# Patient Record
Sex: Female | Born: 1960 | Race: White | Hispanic: No | Marital: Married
Health system: Southern US, Community
[De-identification: ages and names within clinical notes are randomized; demographics above are authoritative.]

---

## 1999-05-27 ENCOUNTER — Other Ambulatory Visit: Admission: RE | Admit: 1999-05-27 | Discharge: 1999-05-27 | Payer: Self-pay | Admitting: Gynecology

## 1999-07-28 ENCOUNTER — Encounter: Admission: RE | Admit: 1999-07-28 | Discharge: 1999-07-28 | Payer: Self-pay | Admitting: Gynecology

## 1999-07-28 ENCOUNTER — Encounter: Payer: Self-pay | Admitting: Gynecology

## 2012-12-19 ENCOUNTER — Other Ambulatory Visit: Payer: Self-pay

## 2012-12-19 DIAGNOSIS — Z1231 Encounter for screening mammogram for malignant neoplasm of breast: Secondary | ICD-10-CM

## 2013-01-06 ENCOUNTER — Ambulatory Visit
Admission: RE | Admit: 2013-01-06 | Discharge: 2013-01-06 | Disposition: A | Discharge status: Home / Self Care | Payer: No Typology Code available for payment source | Source: Ambulatory Visit

## 2013-01-06 DIAGNOSIS — Z1231 Encounter for screening mammogram for malignant neoplasm of breast: Secondary | ICD-10-CM

## 2013-01-08 ENCOUNTER — Other Ambulatory Visit: Payer: Self-pay | Admitting: Family Medicine

## 2013-01-08 DIAGNOSIS — R928 Other abnormal and inconclusive findings on diagnostic imaging of breast: Secondary | ICD-10-CM

## 2013-01-28 ENCOUNTER — Ambulatory Visit
Admission: RE | Admit: 2013-01-28 | Discharge: 2013-01-28 | Disposition: A | Discharge status: Home / Self Care | Payer: No Typology Code available for payment source | Source: Ambulatory Visit | Attending: Family Medicine | Admitting: Family Medicine

## 2013-01-28 DIAGNOSIS — R928 Other abnormal and inconclusive findings on diagnostic imaging of breast: Secondary | ICD-10-CM

## 2014-04-17 ENCOUNTER — Other Ambulatory Visit: Payer: Self-pay

## 2014-04-17 DIAGNOSIS — Z1231 Encounter for screening mammogram for malignant neoplasm of breast: Secondary | ICD-10-CM

## 2014-05-01 ENCOUNTER — Ambulatory Visit
Admission: RE | Admit: 2014-05-01 | Discharge: 2014-05-01 | Disposition: A | Discharge status: Home / Self Care | Payer: No Typology Code available for payment source | Source: Ambulatory Visit

## 2014-05-01 DIAGNOSIS — Z1231 Encounter for screening mammogram for malignant neoplasm of breast: Secondary | ICD-10-CM

## 2016-03-15 ENCOUNTER — Other Ambulatory Visit: Payer: Self-pay | Admitting: Family Medicine

## 2016-03-15 DIAGNOSIS — Z1231 Encounter for screening mammogram for malignant neoplasm of breast: Secondary | ICD-10-CM

## 2016-04-13 ENCOUNTER — Ambulatory Visit
Admission: RE | Admit: 2016-04-13 | Discharge: 2016-04-13 | Disposition: A | Discharge status: Home / Self Care | Payer: Managed Care, Other (non HMO) | Source: Ambulatory Visit | Attending: Family Medicine | Admitting: Family Medicine

## 2016-04-13 DIAGNOSIS — Z1231 Encounter for screening mammogram for malignant neoplasm of breast: Secondary | ICD-10-CM

## 2017-03-14 ENCOUNTER — Other Ambulatory Visit: Payer: Self-pay | Admitting: Family Medicine

## 2017-03-14 DIAGNOSIS — Z1231 Encounter for screening mammogram for malignant neoplasm of breast: Secondary | ICD-10-CM

## 2017-04-16 ENCOUNTER — Ambulatory Visit
Admission: RE | Admit: 2017-04-16 | Discharge: 2017-04-16 | Disposition: A | Discharge status: Home / Self Care | Payer: 59 | Source: Ambulatory Visit | Attending: Family Medicine | Admitting: Family Medicine

## 2017-04-16 ENCOUNTER — Encounter: Payer: Self-pay | Admitting: Radiology

## 2017-04-16 DIAGNOSIS — Z1231 Encounter for screening mammogram for malignant neoplasm of breast: Secondary | ICD-10-CM

## 2018-04-24 ENCOUNTER — Other Ambulatory Visit: Payer: Self-pay | Admitting: Internal Medicine

## 2018-04-24 DIAGNOSIS — Z1231 Encounter for screening mammogram for malignant neoplasm of breast: Secondary | ICD-10-CM

## 2018-04-25 ENCOUNTER — Ambulatory Visit
Admission: RE | Admit: 2018-04-25 | Discharge: 2018-04-25 | Disposition: A | Discharge status: Home / Self Care | Payer: 59 | Source: Ambulatory Visit | Attending: Internal Medicine | Admitting: Internal Medicine

## 2018-04-25 DIAGNOSIS — Z1231 Encounter for screening mammogram for malignant neoplasm of breast: Secondary | ICD-10-CM

## 2019-07-01 ENCOUNTER — Telehealth: Payer: Self-pay | Admitting: Hematology

## 2019-07-01 NOTE — Telephone Encounter (Signed)
Received a new hem referral from Dr. Link Snuffer for eval of factor 5. Ms. Mariah Cobb been cld and scheduled to see Dr. Candise Che on 2/23 at 10am. Pt aware to arrive 15 minutes early.

## 2019-07-07 NOTE — Progress Notes (Signed)
HEMATOLOGY/ONCOLOGY CONSULTATION NOTE  Date of Service: 07/08/2019  Patient Care Team: Patient, No Pcp Per as PCP - General (General Practice)  CHIEF COMPLAINTS/PURPOSE OF CONSULTATION:  Factor V Leiden mutation evaluation  HISTORY OF PRESENTING ILLNESS:   Mariah Cobb is a wonderful 59 y.o. female who has been referred to [place] by Dr Link Snuffer for evaluation and management of factor V leiden mutation. The pt reports that she is doing well overall.   The pt reports that her brother recently had a PE and was found to have a Factor V Leiden mutation. There were no other obvious triggering events for his PE. Her brother is four years younger than her.   Pt has been healthy overall. Her father had Prostate Cancer and her aunt also had issues with her thyroid. Her mother had 1-2 miscarriages and died from a heart attack when she was 56. There were risk factors for her mother's fatal heart attack. Neither of her parents had blood clots that she is aware of. Pt has never had any known blood clots. She has had three pregnancies, has two children, and had one medical abortion. She was on birth control for roughly 20-30 years. Pt had a vaginal repair surgery in her late 20's to early 30's and had no bleeding or clotting issues with it.   She currently takes Synthroid for her hypothyroidism. She has high cholesterol and high triglyceride levels and takes Fenofibrate 145 mg. Pt also takes 10 mg Dicyclomine HCL for her IBS-D. Pt is a non-smoker and does not drink much alcohol outside of social situations. 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   Most recent lab results (11/18/2018) of CBC and CMP is as follows: all values are WNL except for RDW at 11.7, MPV at 6.5.  On review of systems, pt denies unexpected weight loss, abdominal pain and any other symptoms.   On PMHx the pt reports Hypothyroidism, IBS-D, Cataracts, Rosacea. On Social Hx the pt reports she is a non-smoker and does not  drink much alcohol outside of social situations On Family Hx the pt reports a brother recently diagnosed with Factor V Leiden mutation and PE  MEDICAL HISTORY:  No past medical history on file.  Osteoporosis Hypothyroidisim IBS-D  Hypertrigyleridemia  Cataracts  Rosacea   SURGICAL HISTORY: No past surgical history on file. Vaginal repair surgery?  SOCIAL HISTORY: Social History   Socioeconomic History  . Marital status: Married    Spouse name: Not on file  . Number of children: Not on file  . Years of education: Not on file  . Highest education level: Not on file  Occupational History  . Not on file  Tobacco Use  . Smoking status: Not on file  Substance and Sexual Activity  . Alcohol use: Not on file  . Drug use: Not on file  . Sexual activity: Not on file  Other Topics Concern  . Not on file  Social History Narrative  . Not on file   Social Determinants of Health   Financial Resource Strain:   . Difficulty of Paying Living Expenses: Not on file  Food Insecurity:   . Worried About Programme researcher, broadcasting/film/video in the Last Year: Not on file  . Ran Out of Food in the Last Year: Not on file  Transportation Needs:   . Lack of Transportation (Medical): Not on file  . Lack of Transportation (Non-Medical): Not on file  Physical Activity:   . Days of  Exercise per Week: Not on file  . Minutes of Exercise per Session: Not on file  Stress:   . Feeling of Stress : Not on file  Social Connections:   . Frequency of Communication with Friends and Family: Not on file  . Frequency of Social Gatherings with Friends and Family: Not on file  . Attends Religious Services: Not on file  . Active Member of Clubs or Organizations: Not on file  . Attends Banker Meetings: Not on file  . Marital Status: Not on file  Intimate Partner Violence:   . Fear of Current or Ex-Partner: Not on file  . Emotionally Abused: Not on file  . Physically Abused: Not on file  . Sexually Abused:  Not on file    FAMILY HISTORY: Family History  Problem Relation Age of Onset  . Breast cancer Neg Hx   Mother - dead at 78 - Arthritis, CAD, HTN, HLD Father - Prostate Cancer, PD, Alzheimer's disease  Brother - HTN, HLD, Factor 5 Liden mutation with pumonary embolisms   ALLERGIES:  is allergic to gemfibrozil; shellfish allergy; and sulfa antibiotics.  MEDICATIONS:  Current Outpatient Medications  Medication Sig Dispense Refill  . dicyclomine (BENTYL) 10 MG capsule Take 10 mg by mouth 3 (three) times daily as needed.    . doxycycline (PERIOSTAT) 20 MG tablet     . fenofibrate (TRICOR) 145 MG tablet TAKE 1 TABLET BY MOUTH EVERY DAY    . levothyroxine (SYNTHROID) 100 MCG tablet TAKE 1 TABLET BY MOUTH EVERY DAY    . ketoconazole (NIZORAL) 2 % shampoo USE 3   4 TIMES PER WEEK     No current facility-administered medications for this visit.  Dicyclomine HCL 10 mg Oral Capsulre - take one tab prn for IBS [employer] D3 2000 Unit Caps (Cholecaliferol) - take one capsule by mouth daily Zofran 4 mg Oral Tablet  - Take one tablet TID prn for nausea Vitamin D 2000 Unit Oral Capsule (Cholecalciferol)  - one cupsule PO daily  Fenofibrate 145 mg Tablet  - Take 1 tablet by mouth every day  Synthroid 100 mcg Oral Tablet - Take 1 tab daily in the morning  REVIEW OF SYSTEMS:    10 Point review of Systems was done is negative except as noted above.  PHYSICAL EXAMINATION: ECOG PERFORMANCE STATUS: 0 - Asymptomatic  . Vitals:   07/08/19 1005  BP: (!) 143/81  Pulse: 72  Resp: 18  Temp: 98 F (36.7 C)  SpO2: 99%   Filed Weights   07/08/19 1005  Weight: 171 lb 1.6 oz (77.6 kg)   .Body mass index is 27.62 kg/m.  GENERAL:alert, in no acute distress and comfortable SKIN: no acute rashes, no significant lesions EYES: conjunctiva are pink and non-injected, sclera anicteric OROPHARYNX: MMM, no exudates, no oropharyngeal erythema or ulceration NECK: supple, no JVD LYMPH:  no palpable  lymphadenopathy in the cervical, axillary or inguinal regions LUNGS: clear to auscultation b/l with normal respiratory effort HEART: regular rate & rhythm ABDOMEN:  normoactive bowel sounds , non tender, not distended. Extremity: no pedal edema PSYCH: alert & oriented x 3 with fluent speech NEURO: no focal motor/sensory deficits  LABORATORY DATA:  I have reviewed the data as listed  .No flowsheet data found.  .No flowsheet data found.   RADIOGRAPHIC STUDIES: I have personally reviewed the radiological images as listed and agreed with the findings in the report. No results found.  ASSESSMENT & PLAN:   59 yo with  1) Fhx of Hypercoagulable state /Factor V leiden mutation.  Patient's brother recent diagnosed with unporovoked PE and was noted to have Factor V leiden mutation PLAN: -Discussed patient's most recent labs from 11/18/2018, all values are WNL except for RDW at 11.7, MPV at 6.5.  -Advised pt that she would have a 1:2 chance of having heterozygous Factor V Leiden mutation . If present this can result in 4-7 fold increased risk of blood clots often ppt by introducing other risk factors such as birth control pills and surgery, pregnancies etc. -Advised pt that heterozygous Factor V Leiden mutations can present differently depending on the activity levels of each copy of the gene -Advised pt that Factor V Leiden mutation may affect risk profiles for medication - such as hormonal therapies  -Advised pt that knowing if they have a Factor V Leiden mutation may be important for their children or other family that may need to be tested -Will get labs today  -Will see back in 2 weeks via phone    FOLLOW UP: Labs today Phone visit with Dr Irene Limbo in 2 weeks  All of the patients questions were answered with apparent satisfaction. The patient knows to call the clinic with any problems, questions or concerns.  I spent 25 mins counseling the patient face to face. The total time spent  in the appointment was 35 minutes and more than 50% was on counseling and direct patient cares.    Sullivan Lone MD [place] AAHIVMS [facility] Hematology/Oncology Physician [facility]  (Office):       [phone] (Work cell):  [phone] (Fax):           [phone]  07/08/2019 2:44 PM  I, Yevette Edwards, am acting as a scribe for Dr. Sullivan Lone.   .I have reviewed the above documentation for accuracy and completeness, and I agree with the above. Brunetta Genera MD

## 2019-07-08 ENCOUNTER — Other Ambulatory Visit: Payer: Self-pay

## 2019-07-08 ENCOUNTER — Inpatient Hospital Stay: Payer: 59

## 2019-07-08 ENCOUNTER — Inpatient Hospital Stay: Payer: 59 | Attending: Hematology | Admitting: Hematology

## 2019-07-08 VITALS — BP 143/81 | HR 72 | Temp 98.0°F | Resp 18 | Ht 66.0 in | Wt 171.1 lb

## 2019-07-08 DIAGNOSIS — Z832 Family history of diseases of the blood and blood-forming organs and certain disorders involving the immune mechanism: Secondary | ICD-10-CM

## 2019-07-08 DIAGNOSIS — Z8349 Family history of other endocrine, nutritional and metabolic diseases: Secondary | ICD-10-CM | POA: Insufficient documentation

## 2019-07-08 DIAGNOSIS — Z8261 Family history of arthritis: Secondary | ICD-10-CM

## 2019-07-08 DIAGNOSIS — K589 Irritable bowel syndrome without diarrhea: Secondary | ICD-10-CM | POA: Insufficient documentation

## 2019-07-08 DIAGNOSIS — Z882 Allergy status to sulfonamides status: Secondary | ICD-10-CM

## 2019-07-08 DIAGNOSIS — E7800 Pure hypercholesterolemia, unspecified: Secondary | ICD-10-CM | POA: Diagnosis not present

## 2019-07-08 DIAGNOSIS — E039 Hypothyroidism, unspecified: Secondary | ICD-10-CM | POA: Insufficient documentation

## 2019-07-08 DIAGNOSIS — Z818 Family history of other mental and behavioral disorders: Secondary | ICD-10-CM | POA: Diagnosis not present

## 2019-07-08 DIAGNOSIS — D6851 Activated protein C resistance: Secondary | ICD-10-CM | POA: Diagnosis present

## 2019-07-08 DIAGNOSIS — Z8042 Family history of malignant neoplasm of prostate: Secondary | ICD-10-CM | POA: Diagnosis not present

## 2019-07-08 DIAGNOSIS — Z79899 Other long term (current) drug therapy: Secondary | ICD-10-CM | POA: Diagnosis not present

## 2019-07-08 DIAGNOSIS — Z8249 Family history of ischemic heart disease and other diseases of the circulatory system: Secondary | ICD-10-CM | POA: Diagnosis not present

## 2019-07-08 NOTE — Patient Instructions (Signed)
Thank you for choosing [facility] to provide your oncology and hematology care.   Should you have questions after your visit to the [facility] ([facility]), please contact this office at [phone] between 8:30 AM and 4:30 PM.  Voice mails left after 4:00 PM may not be returned until the following business day.  Calls received after 4:30 PM will be answered by an off-site Nurse Triage Line.    Prescription Refills:  Please have your pharmacy contact us directly for most prescription requests.  Contact the office directly for refills of narcotics (pain medications). Allow 48-72 hours for refills.  Appointments: Please contact the [facility] scheduling department [phone] for questions regarding [facility] appointment scheduling.  Contact the schedulers with any scheduling changes so that your appointment can be rescheduled in a timely manner.   Central Scheduling for [place] [phone] - Call to schedule procedures such as PET scans, CT scans, MRI, Ultrasound, etc.  To afford each patient quality time with our providers, please arrive 30 minutes before your scheduled appointment time.  If you arrive late for your appointment, you may be asked to reschedule.  We strive to give you quality time with our providers, and arriving late affects you and other patients whose appointments are after yours. If you are a no show for multiple scheduled visits, you may be dismissed from the clinic at the providers discretion.     Resources: [facility] Social Workers [phone] for additional information on assistance programs or assistance connecting with community support programs   [place] DSS  [phone]: Information regarding food stamps, Medicaid, and utility assistance [place] [phone]   [place] [employer]'s shared-ride transportation service for eligible riders who have a disability that prevents them from riding the fixed route bus.   Medicare Rights Center  800-333-4114 Helps people with Medicare understand their rights and benefits, navigate the Medicare system, and secure the quality healthcare they deserve [employer] 800-227-2345 Assists patients locate various types of support and financial assistance Cancer Care: 1-800-813-HOPE (4673) Provides financial assistance, online support groups, medication/co-pay assistance.   Transportation Assistance for appointments at [facility]: Transportation Coordinator [phone]  Again, thank you for choosing [facility] for your care.       

## 2019-07-11 LAB — FACTOR 5 LEIDEN

## 2019-07-14 LAB — PROTHROMBIN GENE MUTATION

## 2019-07-22 ENCOUNTER — Other Ambulatory Visit: Payer: Self-pay

## 2019-07-22 ENCOUNTER — Inpatient Hospital Stay: Payer: 59 | Attending: Hematology | Admitting: Hematology

## 2019-07-22 DIAGNOSIS — D6851 Activated protein C resistance: Secondary | ICD-10-CM | POA: Diagnosis not present

## 2019-07-22 NOTE — Progress Notes (Signed)
HEMATOLOGY/ONCOLOGY CONSULTATION NOTE  Date of Service: 07/22/2019  Patient Care Team: Patient, No Pcp Per as PCP - General (General Practice)  CHIEF COMPLAINTS/PURPOSE OF CONSULTATION:  Factor V Leiden mutation evaluation  HISTORY OF PRESENTING ILLNESS:   Mariah Cobb is a wonderful 59 y.o. female who has been referred to [place] by Dr Link Snuffer for evaluation and management of factor V leiden mutation. The pt reports that she is doing well overall.   The pt reports that her brother recently had a PE and was found to have a Factor V Leiden mutation. There were no other obvious triggering events for his PE. Her brother is four years younger than her.   Pt has been healthy overall. Her father had Prostate Cancer and her aunt also had issues with her thyroid. Her mother had 1-2 miscarriages and died from a heart attack when she was 61. There were risk factors for her mother's fatal heart attack. Neither of her parents had blood clots that she is aware of. Pt has never had any known blood clots. She has had three pregnancies, has two children, and had one medical abortion. She was on birth control for roughly 20-30 years. Pt had a vaginal repair surgery in her late 20's to early 30's and had no bleeding or clotting issues with it.   She currently takes Synthroid for her hypothyroidism. She has high cholesterol and high triglyceride levels and takes Fenofibrate 145 mg. Pt also takes 10 mg Dicyclomine HCL for her IBS-D. Pt is a non-smoker and does not drink much alcohol outside of social situations. 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   Most recent lab results (11/18/2018) of CBC and CMP is as follows: all values are WNL except for RDW at 11.7, MPV at 6.5.  On review of systems, pt denies unexpected weight loss, abdominal pain and any other symptoms.   On PMHx the pt reports Hypothyroidism, IBS-D, Cataracts, Rosacea. On Social Hx the pt reports she is a non-smoker and does not drink  much alcohol outside of social situations On Family Hx the pt reports a brother recently diagnosed with Factor V Leiden mutation and PE  INTERVAL HISTORY:  I connected with  Verdis Frederickson on 07/22/19 by telephone and verified that I am speaking with the correct person using two identifiers.   I discussed the limitations of evaluation and management by telemedicine. The patient expressed understanding and agreed to proceed.  Other persons participating in the visit and their role in the encounter:       -Carollee Herter, Medical Scribe  Patient's location: Home Provider's location: [facility] at [address] [facility] is a wonderful 59 y.o. female who is here for evaluation and management of factor V leiden mutation. The patient's last visit with [place] was on 07/08/2019. The pt reports that she is doing well overall.  The pt reports that she has been well and has had no new concerns in the interim.  07/08/2019 Prothrombin gene mutation shows "No mutation identified."  07/08/2019 Factor 5 leiden revealed "SINGLE R506Q MUTATION IDENTIFIED (HETEROZYGOTE)"  On review of systems, pt denies any symptoms.   MEDICAL HISTORY:  No past medical history on file.  Osteoporosis Hypothyroidisim IBS-D  Hypertrigyleridemia  Cataracts  Rosacea   SURGICAL HISTORY: No past surgical history on file. Vaginal repair surgery?  SOCIAL HISTORY: Social History   Socioeconomic History  . Marital status: Married    Spouse name: Not on file  .  Number of children: Not on file  . Years of education: Not on file  . Highest education level: Not on file  Occupational History  . Not on file  Tobacco Use  . Smoking status: Not on file  Substance and Sexual Activity  . Alcohol use: Not on file  . Drug use: Not on file  . Sexual activity: Not on file  Other Topics Concern  . Not on file  Social History Narrative  . Not on file   Social Determinants of Health   Financial Resource Strain:   . Difficulty  of Paying Living Expenses: Not on file  Food Insecurity:   . Worried About Programme researcher, broadcasting/film/video in the Last Year: Not on file  . Ran Out of Food in the Last Year: Not on file  Transportation Needs:   . Lack of Transportation (Medical): Not on file  . Lack of Transportation (Non-Medical): Not on file  Physical Activity:   . Days of Exercise per Week: Not on file  . Minutes of Exercise per Session: Not on file  Stress:   . Feeling of Stress : Not on file  Social Connections:   . Frequency of Communication with Friends and Family: Not on file  . Frequency of Social Gatherings with Friends and Family: Not on file  . Attends Religious Services: Not on file  . Active Member of Clubs or Organizations: Not on file  . Attends Banker Meetings: Not on file  . Marital Status: Not on file  Intimate Partner Violence:   . Fear of Current or Ex-Partner: Not on file  . Emotionally Abused: Not on file  . Physically Abused: Not on file  . Sexually Abused: Not on file    FAMILY HISTORY: Family History  Problem Relation Age of Onset  . Breast cancer Neg Hx   Mother - dead at 69 - Arthritis, CAD, HTN, HLD Father - Prostate Cancer, PD, Alzheimer's disease  Brother - HTN, HLD, Factor 5 Liden mutation with pumonary embolisms   ALLERGIES:  is allergic to gemfibrozil; shellfish allergy; and sulfa antibiotics.  MEDICATIONS:  Current Outpatient Medications  Medication Sig Dispense Refill  . dicyclomine (BENTYL) 10 MG capsule Take 10 mg by mouth 3 (three) times daily as needed.    . doxycycline (PERIOSTAT) 20 MG tablet     . fenofibrate (TRICOR) 145 MG tablet TAKE 1 TABLET BY MOUTH EVERY DAY    . ketoconazole (NIZORAL) 2 % shampoo USE 3   4 TIMES PER WEEK    . levothyroxine (SYNTHROID) 100 MCG tablet TAKE 1 TABLET BY MOUTH EVERY DAY     No current facility-administered medications for this visit.  Dicyclomine HCL 10 mg Oral Capsulre - take one tab prn for IBS CVS D3 2000 Unit Caps  (Cholecaliferol) - take one capsule by mouth daily Zofran 4 mg Oral Tablet  - Take one tablet TID prn for nausea Vitamin D 2000 Unit Oral Capsule (Cholecalciferol)  - one cupsule PO daily  Fenofibrate 145 mg Tablet  - Take 1 tablet by mouth every day  Synthroid 100 mcg Oral Tablet - Take 1 tab daily in the morning  REVIEW OF SYSTEMS:    10 Point review of Systems was done is negative except as noted above.  PHYSICAL EXAMINATION: ECOG PERFORMANCE STATUS: 0 - Asymptomatic  Telehealth visit   LABORATORY DATA:  I have reviewed the data as listed  Prothrombin gene mutation Order: [id number] Status:  Final result Visible to patient:  Yes (MyChart) Next appt:  None Dx:  FHx: factor V Leiden mutation; Family... Component 2 wk ago  Recommendations-PTGENE: Comment   Comment: (NOTE)  NEGATIVE  No mutation identified.         Contains abnormal data Factor 5 leiden Order: [id number] Status:  Final result Visible to patient:  Yes (MyChart) Next appt:  None Dx:  FHx: factor V Leiden mutation; Family... Component 2 wk ago  Recommendations-F5LEID: CommentAbnormal    Comment: (NOTE)  RESULT: SINGLE R506Q MUTATION IDENTIFIED (HETEROZYGOTE)          RADIOGRAPHIC STUDIES: I have personally reviewed the radiological images as listed and agreed with the findings in the report. No results found.  ASSESSMENT & PLAN:   59 yo with   1) Fhx of Hypercoagulable state/Factor V leiden mutation.  Patient's brother recent diagnosed with unporovoked PE and was noted to have Factor V leiden mutation  2) Newly diagnosed heterozygous factor V leiden mutation PLAN: -Discussed pt labwork, 07/08/2019; Prothrombin gene mutation shows "No mutation identified.", Factor 5 leiden revealed "SINGLE R506Q MUTATION IDENTIFIED (HETEROZYGOTE)" -Advised pt again that heterozygous Factor V Leiden mutation can result in 4-7 fold increased risk of blood clots. -Advised pt that her presentation has been very mild  thus far and unlikely to change based on current circumstances -Advised pt that it would be an important factor if she considers hormone replacement therapy or future surgeries -Advised pt that she has a 1:2 chance in passing along a heterozygous Factor V Leiden mutation to her children -Due to heterozygous Factor V Leiden state would no be unreasonable for pt to take a daily baby Aspirin as prophylaxis  -Recommend pt stay active, stay close to ideal bodyweight, stay well hydrated, avoid medications that increase the risk of blood clots, avoid leg crossing and move about every hour while traveling -Will see back as needed  FOLLOW UP: RTC with Dr Irene Limbo as needed  The total time spent in the appt was 15 minutes and more than 50% was on counseling and direct patient cares.  All of the patient's questions were answered with apparent satisfaction. The patient knows to call the clinic with any problems, questions or concerns.    Sullivan Lone MD [place] AAHIVMS [facility] Hematology/Oncology Physician [facility]  (Office):       [phone] (Work cell):  [phone] (Fax):           [phone]  07/22/2019 2:26 PM  I, Yevette Edwards, am acting as a scribe for Dr. Sullivan Lone.   .I have reviewed the above documentation for accuracy and completeness, and I agree with the above. Brunetta Genera MD

## 2020-01-30 IMAGING — MG DIGITAL SCREENING BILATERAL MAMMOGRAM WITH CAD
4 series · 4 of 4 positions shown · non-contrast
Comparison: Previous exam(s).

CLINICAL DATA: Screening.

EXAM:
DIGITAL SCREENING BILATERAL MAMMOGRAM WITH CAD

[R MLO]
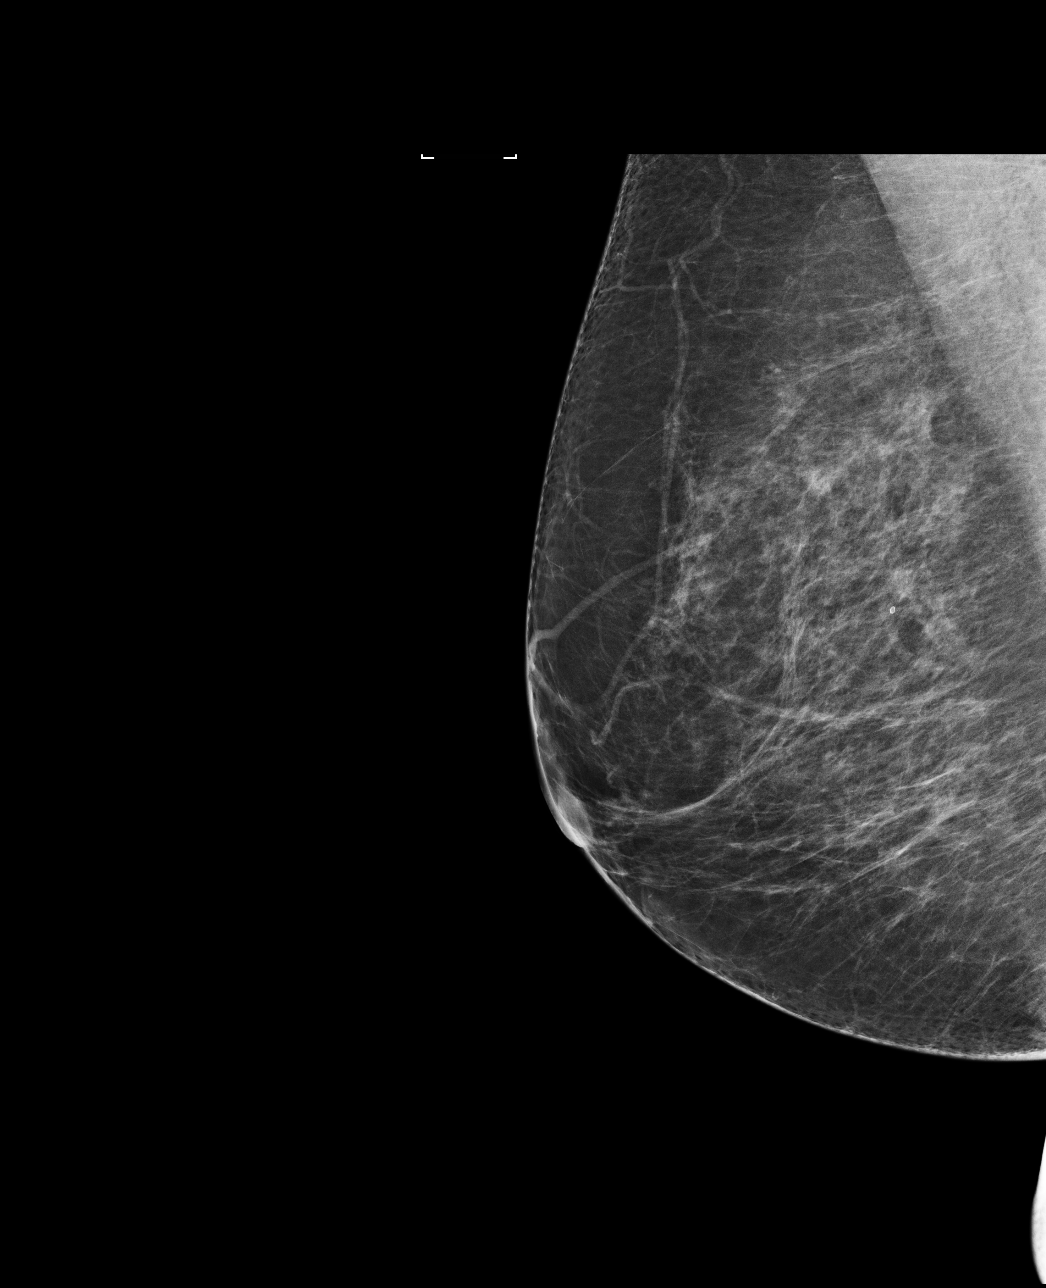

[L CC]
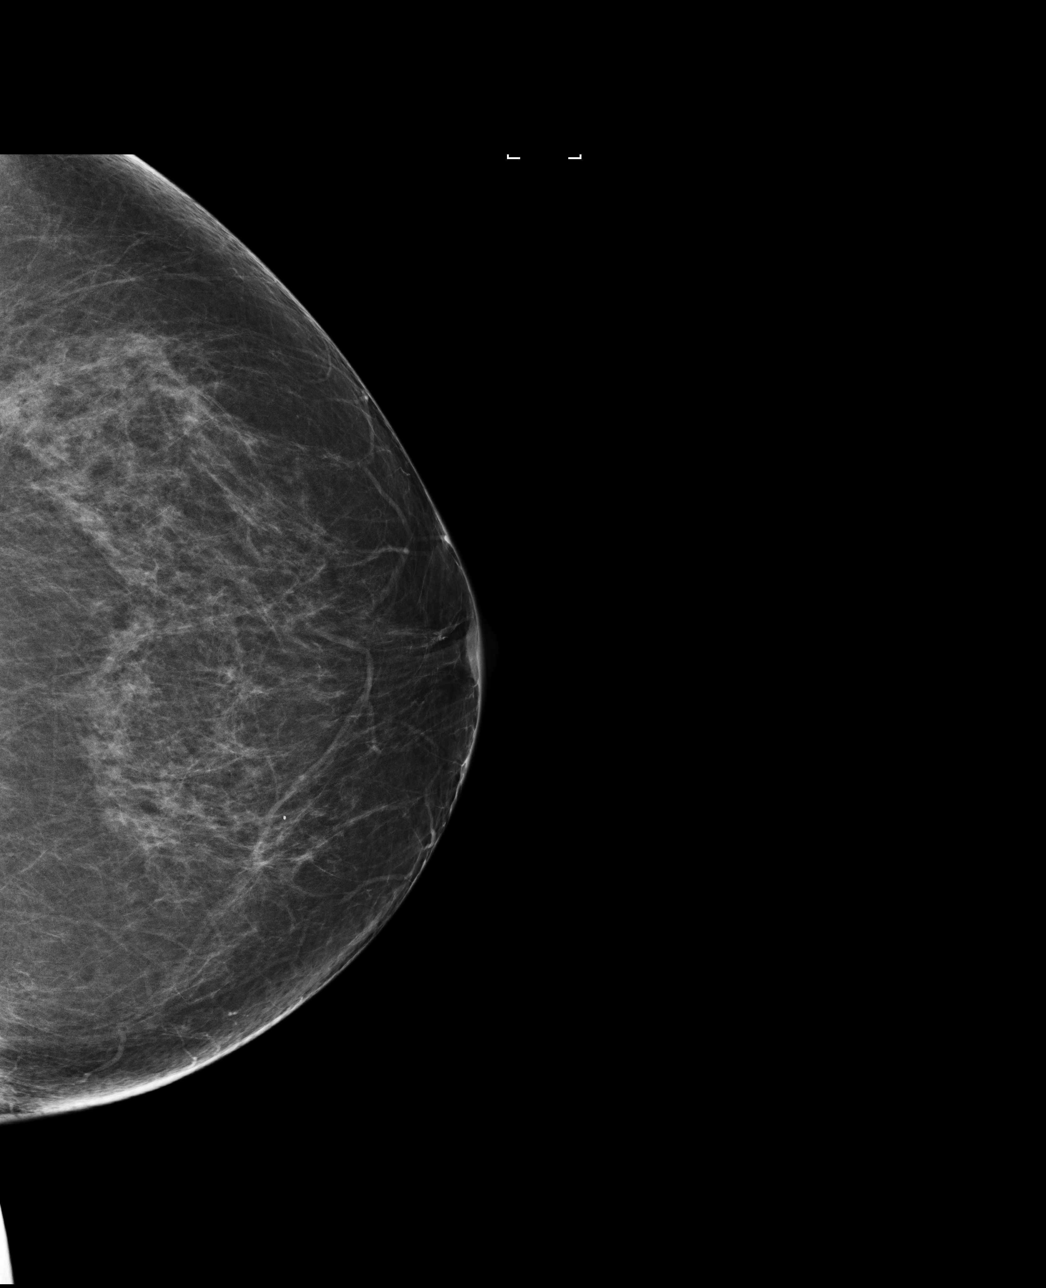

[L MLO]
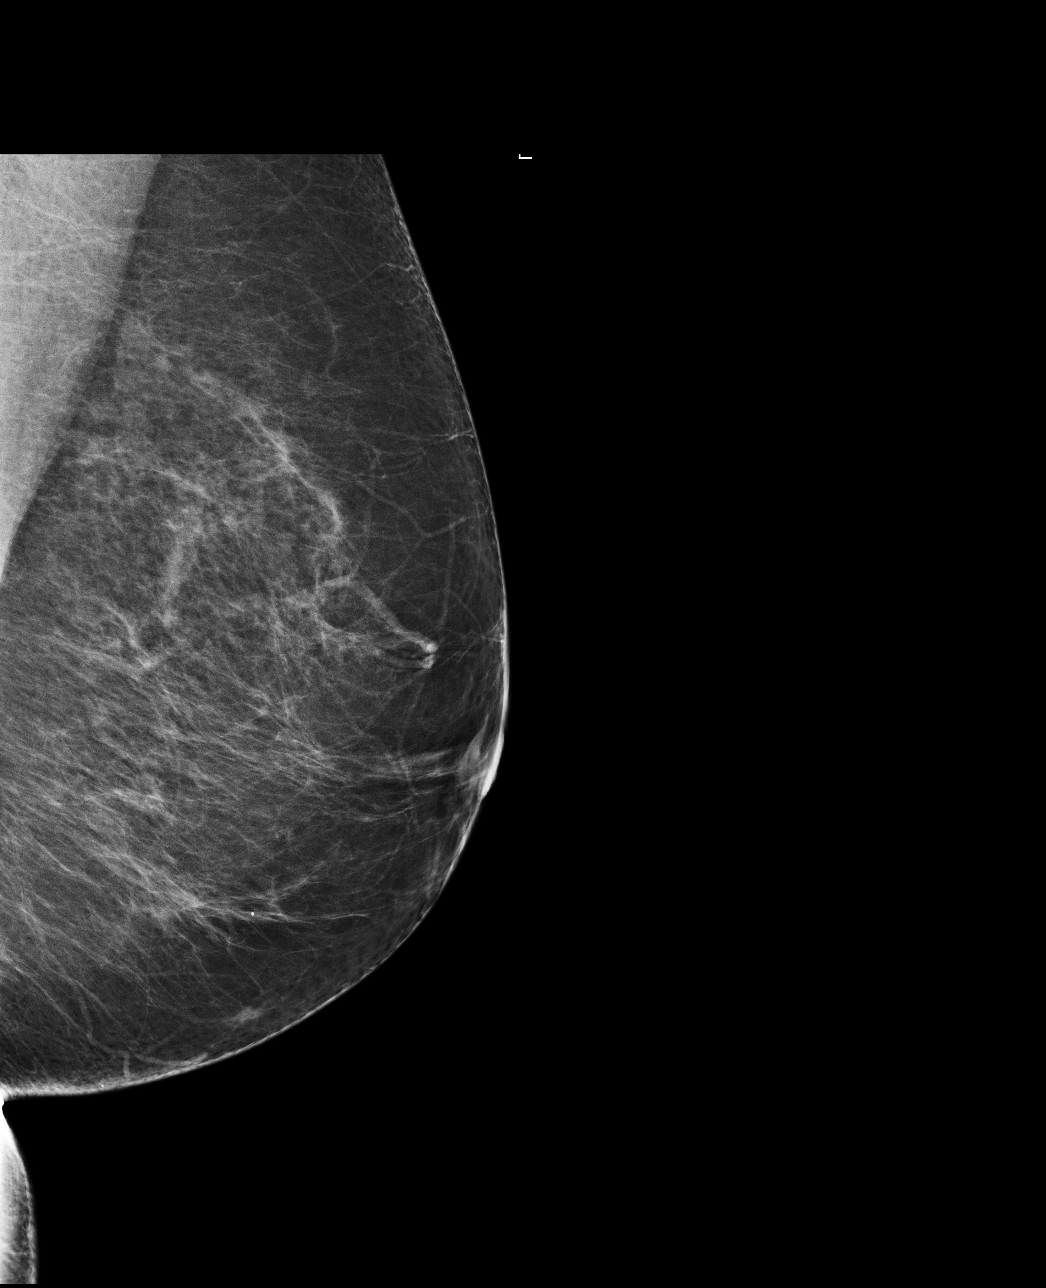

[R CC]
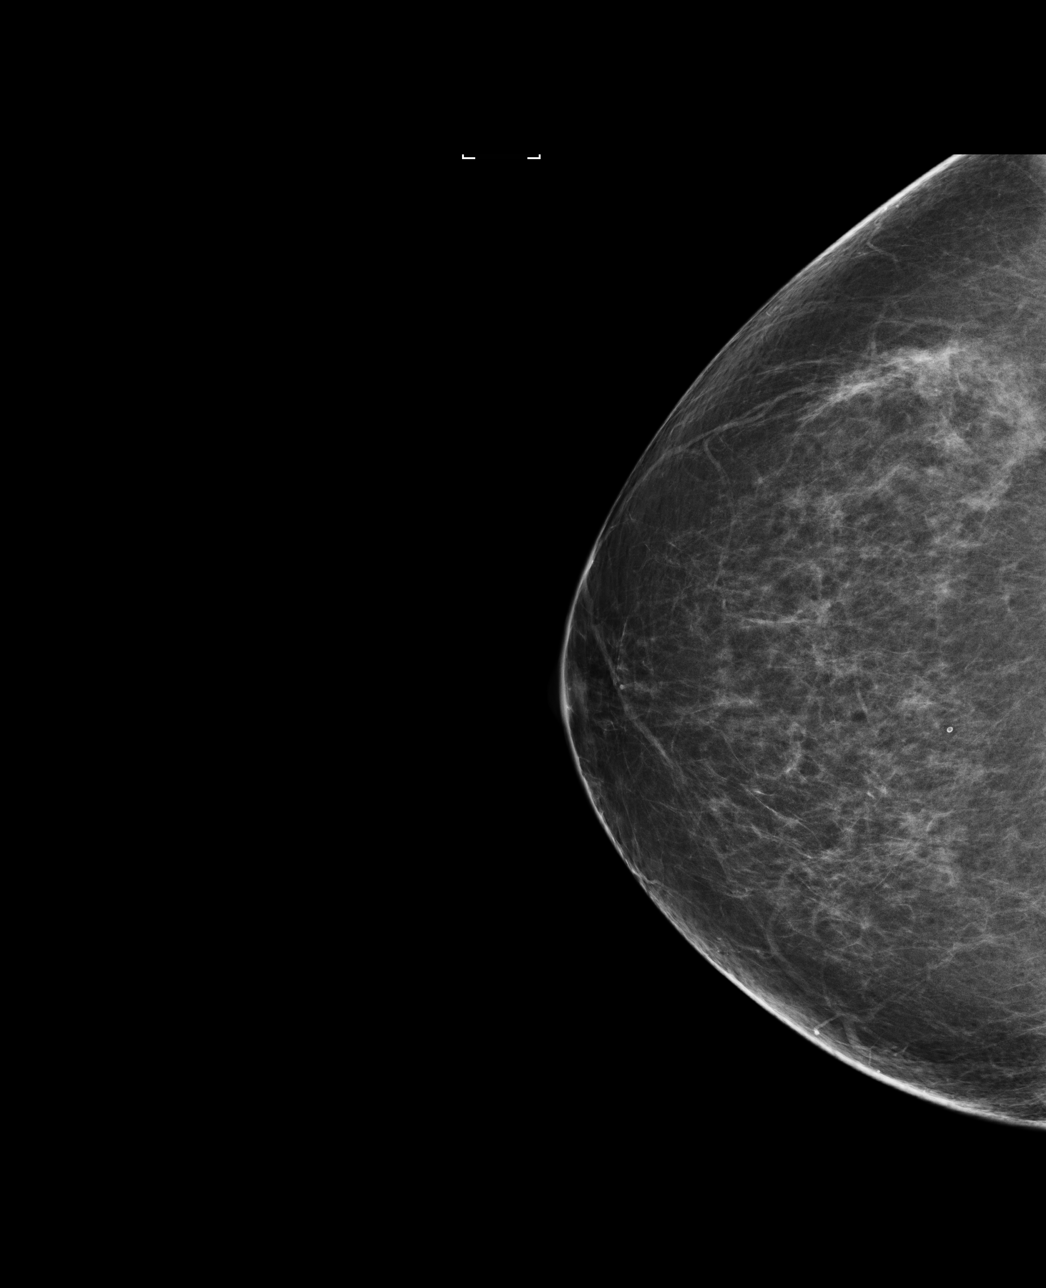

[4 of 4 positions shown; findings below may reference images not displayed]

ACR Breast Density Category b: There are scattered areas of
fibroglandular density.
FINDINGS: There are no findings suspicious for malignancy. Images were
processed with CAD.
IMPRESSION: No mammographic evidence of malignancy. A result letter of this
screening mammogram will be mailed directly to the patient.

RECOMMENDATION:
Screening mammogram in one year. (Code:AS-G-LCT)

BI-RADS CATEGORY  1: Negative.

## 2022-01-12 ENCOUNTER — Other Ambulatory Visit: Payer: Self-pay | Admitting: Internal Medicine

## 2022-01-12 DIAGNOSIS — Z1231 Encounter for screening mammogram for malignant neoplasm of breast: Secondary | ICD-10-CM

## 2022-01-17 ENCOUNTER — Ambulatory Visit
Admission: RE | Admit: 2022-01-17 | Discharge: 2022-01-17 | Disposition: A | Discharge status: Home / Self Care | Payer: No Typology Code available for payment source | Source: Ambulatory Visit | Attending: Internal Medicine | Admitting: Internal Medicine

## 2022-01-17 DIAGNOSIS — Z1231 Encounter for screening mammogram for malignant neoplasm of breast: Secondary | ICD-10-CM
